# Patient Record
Sex: Male | Born: 1955 | Race: White | Hispanic: No | Marital: Single | Smoking: Never smoker
Health system: Southern US, Community
[De-identification: ages and names within clinical notes are randomized; demographics above are authoritative.]

## PROBLEM LIST (undated history)

## (undated) HISTORY — PX: HERNIA REPAIR: SHX51

## (undated) HISTORY — PX: CATARACT EXTRACTION: SUR2

---
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
## 2004-06-11 ENCOUNTER — Ambulatory Visit (HOSPITAL_COMMUNITY): Admission: RE | Admit: 2004-06-11 | Discharge: 2004-06-11 | Payer: Self-pay | Admitting: Preventative Medicine

## 2005-11-19 ENCOUNTER — Emergency Department (HOSPITAL_COMMUNITY): Admission: EM | Admit: 2005-11-19 | Discharge: 2005-11-19 | Payer: Self-pay | Admitting: Emergency Medicine

## 2009-10-01 ENCOUNTER — Emergency Department (HOSPITAL_COMMUNITY): Admission: EM | Admit: 2009-10-01 | Discharge: 2009-10-01 | Payer: Self-pay | Admitting: Emergency Medicine

## 2010-05-22 ENCOUNTER — Encounter: Payer: Self-pay | Admitting: Internal Medicine

## 2010-07-19 LAB — POCT I-STAT, CHEM 8
BUN: 24 mg/dL — ABNORMAL HIGH (ref 6–23)
Calcium, Ion: 1.16 mmol/L (ref 1.12–1.32)
Chloride: 105 mEq/L (ref 96–112)
Creatinine, Ser: 1 mg/dL (ref 0.4–1.5)
Glucose, Bld: 102 mg/dL — ABNORMAL HIGH (ref 70–99)
HCT: 49 % (ref 39.0–52.0)
Hemoglobin: 16.7 g/dL (ref 13.0–17.0)
Potassium: 4.4 mEq/L (ref 3.5–5.1)
Sodium: 139 mEq/L (ref 135–145)
TCO2: 29 mmol/L (ref 0–100)

## 2011-05-05 IMAGING — CR DG FOOT COMPLETE 3+V*L*
3 series · 3 of 3 positions shown · non-contrast
Comparison: None.

CLINICAL DATA: Pain.

LEFT FOOT - COMPLETE 3+ VIEW

[t foot ap left]
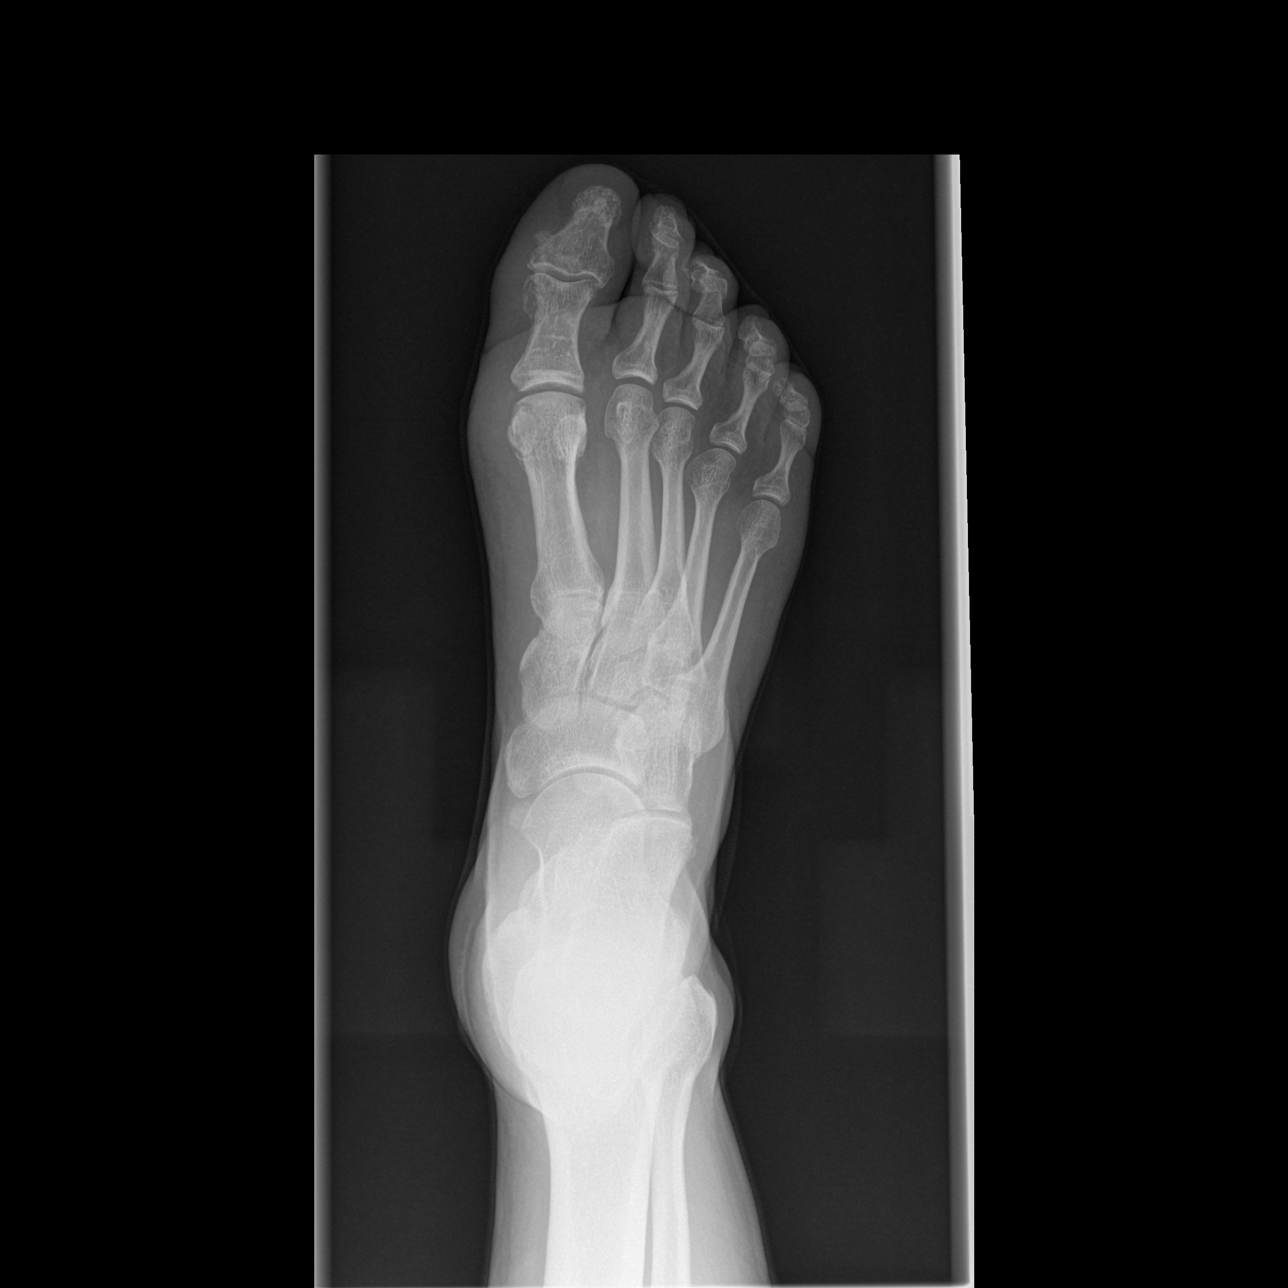

[t foot oblique left]
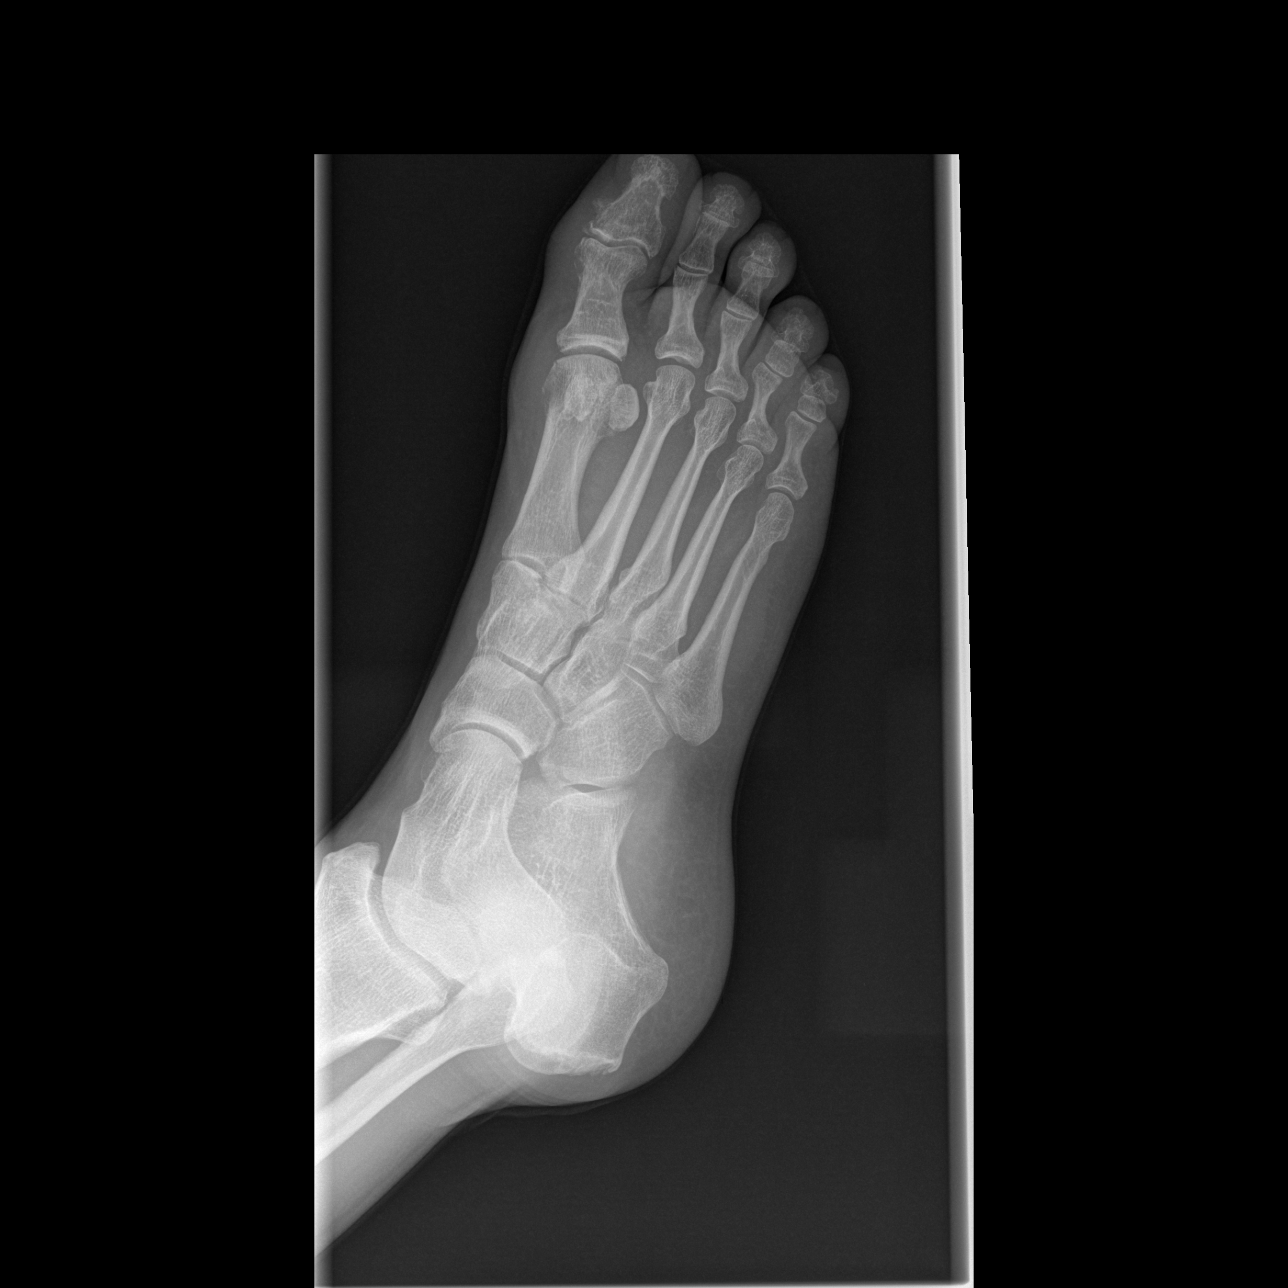

[t foot lat left]
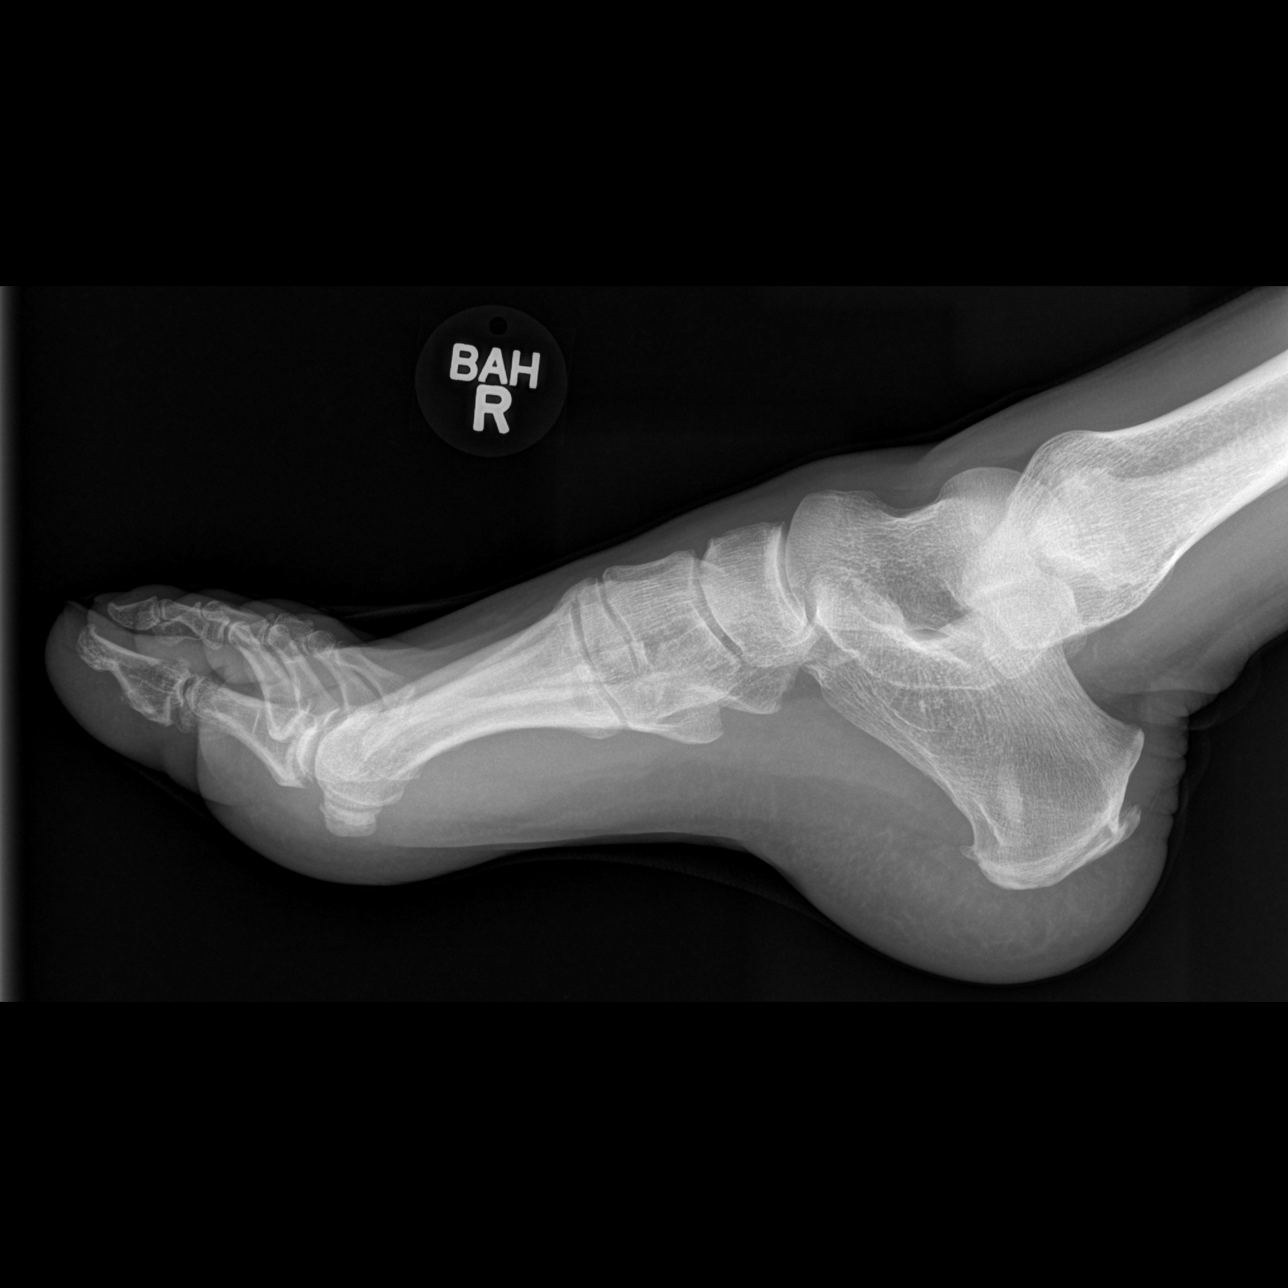

[3 of 3 positions shown; findings below may reference images not displayed]

FINDINGS: No fracture or bony displacement.  No joint space
narrowing.  Sclerotic density in the posterior aspect of the
calcaneus is due to a benign bone island.
IMPRESSION: No acute bony abnormality.

## 2011-08-12 ENCOUNTER — Telehealth: Payer: Self-pay

## 2011-08-12 NOTE — Telephone Encounter (Signed)
Pts wife called and states patient needs a refill on klonepin called in to the [facility] on [address]

## 2011-08-13 NOTE — Telephone Encounter (Signed)
Chart is at nurses station for review 

## 2011-08-14 ENCOUNTER — Telehealth: Payer: Self-pay

## 2011-08-14 NOTE — Telephone Encounter (Signed)
SPOKE WITH [facility] ON Friday; WAITING FOR PA APPROVAL FOR CLONAPIN.  RX RUNS OUT ON Monday 4/15.  BEST NUMBER IS [id number].

## 2011-08-15 MED ORDER — CLONAZEPAM 0.5 MG PO TABS: 0.5000 mg | ORAL_TABLET | Freq: Three times a day (TID) | ORAL | Status: DC | PRN

## 2011-08-15 NOTE — Telephone Encounter (Signed)
Informed pt he can p/u klonopin.

## 2011-08-15 NOTE — Telephone Encounter (Signed)
RX done.  Have pt follow up when Zachary Randolph when she returns from maternity leave in July.

## 2011-08-15 NOTE — Telephone Encounter (Signed)
Spoke with pt and advised Rx faxed to pharmacy. Reminded him to follow up with Zachary Randolph in July

## 2011-08-15 NOTE — Telephone Encounter (Signed)
Refilled this morning.  Rx at [facility]

## 2012-02-14 ENCOUNTER — Other Ambulatory Visit: Payer: Self-pay | Admitting: Physician Assistant

## 2012-02-14 NOTE — Telephone Encounter (Signed)
At tl desk 

## 2012-02-15 ENCOUNTER — Ambulatory Visit (INDEPENDENT_AMBULATORY_CARE_PROVIDER_SITE_OTHER): Payer: Medicare Other | Admitting: Physician Assistant

## 2012-02-15 ENCOUNTER — Encounter: Payer: Self-pay | Admitting: Physician Assistant

## 2012-02-15 VITALS — BP 120/72 | HR 103 | Temp 98.2°F | Resp 16 | Ht 67.5 in | Wt 167.2 lb

## 2012-02-15 DIAGNOSIS — F411 Generalized anxiety disorder: Secondary | ICD-10-CM

## 2012-02-15 DIAGNOSIS — F419 Anxiety disorder, unspecified: Secondary | ICD-10-CM | POA: Insufficient documentation

## 2012-02-15 MED ORDER — KLONOPIN 0.5 MG PO TABS: 0.5000 mg | ORAL_TABLET | Freq: Three times a day (TID) | ORAL | Status: DC
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
## 2012-11-30 DIAGNOSIS — Z0271 Encounter for disability determination: Secondary | ICD-10-CM

## 2014-06-09 ENCOUNTER — Telehealth: Payer: Self-pay

## 2014-06-09 NOTE — Telephone Encounter (Signed)
Help Zachary Randolph. Do you remember who you referred him to.

## 2014-06-09 NOTE — Telephone Encounter (Signed)
Pt would love a CB from Zachary LennertSarah Randolph. He wants to know which doctor she referred him to in our practice. I could not find it. He was last seen on 02/15/12. Please advise [phone]

## 2014-06-10 NOTE — Telephone Encounter (Signed)
He needs to be an MD due to his medicare status.  I think any of the MDs (that are going to be here for a while) that take appts at [facility] would be good

## 2014-06-11 ENCOUNTER — Telehealth: Payer: Self-pay

## 2014-06-11 NOTE — Telephone Encounter (Signed)
LMVM for patient to CB to schedule an appointment with one of the doctors per Benny LennertSarah Weber.

## 2014-06-11 NOTE — Telephone Encounter (Signed)
Zachary Randolph was calling concerning a CB her husband just received. CB#[phone]

## 2014-06-11 NOTE — Telephone Encounter (Signed)
Informed pt of situation. Transferred patient to scheduling to get set up with an MD.

## 2014-07-11 ENCOUNTER — Ambulatory Visit: Payer: Self-pay | Admitting: Family Medicine

## 2015-07-28 DIAGNOSIS — I10 Essential (primary) hypertension: Secondary | ICD-10-CM | POA: Diagnosis not present

## 2015-07-31 DIAGNOSIS — R69 Illness, unspecified: Secondary | ICD-10-CM | POA: Diagnosis not present

## 2015-07-31 DIAGNOSIS — R739 Hyperglycemia, unspecified: Secondary | ICD-10-CM | POA: Diagnosis not present

## 2015-07-31 DIAGNOSIS — E782 Mixed hyperlipidemia: Secondary | ICD-10-CM | POA: Diagnosis not present

## 2015-07-31 DIAGNOSIS — R945 Abnormal results of liver function studies: Secondary | ICD-10-CM | POA: Diagnosis not present

## 2015-12-08 DIAGNOSIS — M9902 Segmental and somatic dysfunction of thoracic region: Secondary | ICD-10-CM | POA: Diagnosis not present

## 2015-12-08 DIAGNOSIS — M62830 Muscle spasm of back: Secondary | ICD-10-CM | POA: Diagnosis not present

## 2015-12-08 DIAGNOSIS — M9901 Segmental and somatic dysfunction of cervical region: Secondary | ICD-10-CM | POA: Diagnosis not present

## 2015-12-08 DIAGNOSIS — M5134 Other intervertebral disc degeneration, thoracic region: Secondary | ICD-10-CM | POA: Diagnosis not present

## 2015-12-09 DIAGNOSIS — M9902 Segmental and somatic dysfunction of thoracic region: Secondary | ICD-10-CM | POA: Diagnosis not present

## 2015-12-09 DIAGNOSIS — M5134 Other intervertebral disc degeneration, thoracic region: Secondary | ICD-10-CM | POA: Diagnosis not present

## 2015-12-09 DIAGNOSIS — M9901 Segmental and somatic dysfunction of cervical region: Secondary | ICD-10-CM | POA: Diagnosis not present

## 2015-12-09 DIAGNOSIS — M62830 Muscle spasm of back: Secondary | ICD-10-CM | POA: Diagnosis not present

## 2015-12-11 DIAGNOSIS — M9901 Segmental and somatic dysfunction of cervical region: Secondary | ICD-10-CM | POA: Diagnosis not present

## 2015-12-11 DIAGNOSIS — M62830 Muscle spasm of back: Secondary | ICD-10-CM | POA: Diagnosis not present

## 2015-12-11 DIAGNOSIS — M5134 Other intervertebral disc degeneration, thoracic region: Secondary | ICD-10-CM | POA: Diagnosis not present

## 2015-12-11 DIAGNOSIS — M9902 Segmental and somatic dysfunction of thoracic region: Secondary | ICD-10-CM | POA: Diagnosis not present

## 2015-12-15 DIAGNOSIS — M5134 Other intervertebral disc degeneration, thoracic region: Secondary | ICD-10-CM | POA: Diagnosis not present

## 2015-12-15 DIAGNOSIS — M9902 Segmental and somatic dysfunction of thoracic region: Secondary | ICD-10-CM | POA: Diagnosis not present

## 2015-12-15 DIAGNOSIS — M9901 Segmental and somatic dysfunction of cervical region: Secondary | ICD-10-CM | POA: Diagnosis not present

## 2015-12-15 DIAGNOSIS — M62830 Muscle spasm of back: Secondary | ICD-10-CM | POA: Diagnosis not present

## 2015-12-17 DIAGNOSIS — M9902 Segmental and somatic dysfunction of thoracic region: Secondary | ICD-10-CM | POA: Diagnosis not present

## 2015-12-17 DIAGNOSIS — M9901 Segmental and somatic dysfunction of cervical region: Secondary | ICD-10-CM | POA: Diagnosis not present

## 2015-12-17 DIAGNOSIS — M5134 Other intervertebral disc degeneration, thoracic region: Secondary | ICD-10-CM | POA: Diagnosis not present

## 2015-12-17 DIAGNOSIS — M62830 Muscle spasm of back: Secondary | ICD-10-CM | POA: Diagnosis not present

## 2015-12-22 DIAGNOSIS — M62830 Muscle spasm of back: Secondary | ICD-10-CM | POA: Diagnosis not present

## 2015-12-22 DIAGNOSIS — M5134 Other intervertebral disc degeneration, thoracic region: Secondary | ICD-10-CM | POA: Diagnosis not present

## 2015-12-22 DIAGNOSIS — M9901 Segmental and somatic dysfunction of cervical region: Secondary | ICD-10-CM | POA: Diagnosis not present

## 2015-12-22 DIAGNOSIS — M9902 Segmental and somatic dysfunction of thoracic region: Secondary | ICD-10-CM | POA: Diagnosis not present

## 2015-12-29 DIAGNOSIS — M62830 Muscle spasm of back: Secondary | ICD-10-CM | POA: Diagnosis not present

## 2015-12-29 DIAGNOSIS — M9902 Segmental and somatic dysfunction of thoracic region: Secondary | ICD-10-CM | POA: Diagnosis not present

## 2015-12-29 DIAGNOSIS — M5134 Other intervertebral disc degeneration, thoracic region: Secondary | ICD-10-CM | POA: Diagnosis not present

## 2015-12-29 DIAGNOSIS — M9901 Segmental and somatic dysfunction of cervical region: Secondary | ICD-10-CM | POA: Diagnosis not present

## 2016-01-12 DIAGNOSIS — M62830 Muscle spasm of back: Secondary | ICD-10-CM | POA: Diagnosis not present

## 2016-01-12 DIAGNOSIS — M9902 Segmental and somatic dysfunction of thoracic region: Secondary | ICD-10-CM | POA: Diagnosis not present

## 2016-01-12 DIAGNOSIS — M5134 Other intervertebral disc degeneration, thoracic region: Secondary | ICD-10-CM | POA: Diagnosis not present

## 2016-01-12 DIAGNOSIS — M9901 Segmental and somatic dysfunction of cervical region: Secondary | ICD-10-CM | POA: Diagnosis not present

## 2016-01-26 DIAGNOSIS — M5134 Other intervertebral disc degeneration, thoracic region: Secondary | ICD-10-CM | POA: Diagnosis not present

## 2016-01-26 DIAGNOSIS — M9902 Segmental and somatic dysfunction of thoracic region: Secondary | ICD-10-CM | POA: Diagnosis not present

## 2016-01-26 DIAGNOSIS — M62830 Muscle spasm of back: Secondary | ICD-10-CM | POA: Diagnosis not present

## 2016-01-26 DIAGNOSIS — M9901 Segmental and somatic dysfunction of cervical region: Secondary | ICD-10-CM | POA: Diagnosis not present

## 2016-02-16 DIAGNOSIS — E784 Other hyperlipidemia: Secondary | ICD-10-CM | POA: Diagnosis not present

## 2016-02-16 DIAGNOSIS — R7301 Impaired fasting glucose: Secondary | ICD-10-CM | POA: Diagnosis not present

## 2016-02-16 DIAGNOSIS — E781 Pure hyperglyceridemia: Secondary | ICD-10-CM | POA: Diagnosis not present

## 2016-09-09 DIAGNOSIS — R69 Illness, unspecified: Secondary | ICD-10-CM | POA: Diagnosis not present

## 2016-09-09 DIAGNOSIS — E119 Type 2 diabetes mellitus without complications: Secondary | ICD-10-CM | POA: Diagnosis not present

## 2016-12-15 DIAGNOSIS — H903 Sensorineural hearing loss, bilateral: Secondary | ICD-10-CM | POA: Diagnosis not present

## 2017-03-08 DIAGNOSIS — R69 Illness, unspecified: Secondary | ICD-10-CM | POA: Diagnosis not present

## 2017-08-22 DIAGNOSIS — M71571 Other bursitis, not elsewhere classified, right ankle and foot: Secondary | ICD-10-CM | POA: Diagnosis not present

## 2017-08-22 DIAGNOSIS — M10071 Idiopathic gout, right ankle and foot: Secondary | ICD-10-CM | POA: Diagnosis not present

## 2018-04-03 DIAGNOSIS — R69 Illness, unspecified: Secondary | ICD-10-CM | POA: Diagnosis not present

## 2018-07-27 ENCOUNTER — Other Ambulatory Visit (HOSPITAL_COMMUNITY): Payer: Self-pay | Admitting: Podiatry

## 2018-07-27 ENCOUNTER — Other Ambulatory Visit: Payer: Self-pay | Admitting: Podiatry

## 2018-07-27 ENCOUNTER — Ambulatory Visit: Admission: RE | Admit: 2018-07-27 | Payer: Medicare HMO | Source: Ambulatory Visit

## 2018-07-27 DIAGNOSIS — I890 Lymphedema, not elsewhere classified: Secondary | ICD-10-CM | POA: Diagnosis not present

## 2018-07-27 DIAGNOSIS — M10071 Idiopathic gout, right ankle and foot: Secondary | ICD-10-CM | POA: Diagnosis not present

## 2018-07-27 DIAGNOSIS — M79605 Pain in left leg: Secondary | ICD-10-CM

## 2018-07-27 DIAGNOSIS — M10072 Idiopathic gout, left ankle and foot: Secondary | ICD-10-CM | POA: Diagnosis not present

## 2022-09-01 ENCOUNTER — Encounter: Payer: Self-pay | Admitting: General Surgery

## 2022-09-01 ENCOUNTER — Ambulatory Visit: Payer: Medicare HMO | Admitting: General Surgery

## 2022-09-01 ENCOUNTER — Other Ambulatory Visit: Payer: Self-pay

## 2022-09-01 VITALS — BP 128/81 | HR 105 | Temp 98.0°F | Resp 18 | Ht 67.5 in | Wt 169.0 lb

## 2022-09-01 DIAGNOSIS — L821 Other seborrheic keratosis: Secondary | ICD-10-CM

## 2022-09-02 NOTE — Progress Notes (Signed)
Zachary Randolph; [id number]; January 20, 1956   HPI Patient is a 67 year old white male who returns back to my care for growth on his right elbow.  I apparently took this growth off 18 years ago.  It has since recurred.  At that time, it appeared to be benign in nature and consistent with a seborrheic keratotic lesion.  Patient would like to the growth removed. History reviewed. No pertinent past medical history.  Past Surgical History:  Procedure Laterality Date   CATARACT EXTRACTION     HERNIA REPAIR  1968    Family History  Problem Relation Age of Onset   Diabetes Father    Cancer Father     Current Outpatient Medications on File Prior to Visit  Medication Sig Dispense Refill   Multiple Vitamin (MULTIVITAMIN WITH MINERALS) TABS tablet Take 1 tablet by mouth daily.     Omega-3 Fatty Acids (FISH OIL) 500 MG CAPS Take 500 mg by mouth 3 (three) times daily.     No current facility-administered medications on file prior to visit.    Allergies  Allergen Reactions   Benadryl [Diphenhydramine]    Levaquin [Levofloxacin]    Statins     Social History   Substance and Sexual Activity  Alcohol Use No    Social History   Tobacco Use  Smoking Status Never  Smokeless Tobacco Not on file    Review of Systems  Constitutional: Negative.   HENT: Negative.    Eyes: Negative.   Respiratory: Negative.    Cardiovascular: Negative.   Gastrointestinal: Negative.   Genitourinary: Negative.   Musculoskeletal: Negative.   Neurological: Negative.   Endo/Heme/Allergies: Negative.   Psychiatric/Behavioral: Negative.      Objective   Vitals:   09/01/22 0928  BP: 128/81  Pulse: (!) 105  Resp: 18  Temp: 98 F (36.7 C)  SpO2: 93%    Physical Exam Vitals reviewed.  Constitutional:      Appearance: Normal appearance. He is not ill-appearing.  HENT:     Head: Normocephalic and atraumatic.  Cardiovascular:     Rate and Rhythm: Normal rate and regular rhythm.     Heart sounds: Normal  heart sounds. No murmur heard.    No friction rub. No gallop.  Pulmonary:     Effort: Pulmonary effort is normal. No respiratory distress.     Breath sounds: Normal breath sounds. No stridor. No wheezing, rhonchi or rales.  Skin:    General: Skin is warm and dry.     Comments: Dry 0.75 cm to 1 cm raised keratotic lesion on the right elbow.  It is mobile.  No satellite lesions present.  No erythema noted.   Neurological:     Mental Status: He is alert and oriented to person, place, and time.   Note: Right elbow prepped and draped with Betadine.  1% Xylocaine was used for local anesthesia.  Both eye cautery and a knife was used to shave the lesion off.  Patient tolerated the procedure well.  Patient did not want it sent for pathology, which is fine with me as it appeared to be benign.  Assessment  Seborrheic keratosis, right elbow, rmeoved Plan  Patient to keep wound clean and dry.  Follow-up here as needed.
